# Patient Record
Sex: Male | Born: 1975 | Race: White | Hispanic: No | Marital: Married | Smoking: Never smoker
Health system: Southern US, Community
[De-identification: ages and names within clinical notes are randomized; demographics above are authoritative.]

## PROBLEM LIST (undated history)

## (undated) DIAGNOSIS — W3400XA Accidental discharge from unspecified firearms or gun, initial encounter: Secondary | ICD-10-CM

## (undated) DIAGNOSIS — R0789 Other chest pain: Secondary | ICD-10-CM

## (undated) DIAGNOSIS — R091 Pleurisy: Secondary | ICD-10-CM

## (undated) DIAGNOSIS — E785 Hyperlipidemia, unspecified: Secondary | ICD-10-CM

## (undated) DIAGNOSIS — R58 Hemorrhage, not elsewhere classified: Secondary | ICD-10-CM

## (undated) HISTORY — DX: Pleurisy: R09.1

## (undated) HISTORY — DX: Other chest pain: R07.89

## (undated) HISTORY — DX: Hyperlipidemia, unspecified: E78.5

## (undated) HISTORY — DX: Hemorrhage, not elsewhere classified: R58

## (undated) HISTORY — DX: Accidental discharge from unspecified firearms or gun, initial encounter: W34.00XA

---

## 1994-11-30 DIAGNOSIS — W3400XA Accidental discharge from unspecified firearms or gun, initial encounter: Secondary | ICD-10-CM

## 1994-11-30 DIAGNOSIS — Y249XXA Unspecified firearm discharge, undetermined intent, initial encounter: Secondary | ICD-10-CM

## 1994-11-30 HISTORY — DX: Unspecified firearm discharge, undetermined intent, initial encounter: Y24.9XXA

## 1994-11-30 HISTORY — DX: Accidental discharge from unspecified firearms or gun, initial encounter: W34.00XA

## 1999-11-30 ENCOUNTER — Emergency Department (HOSPITAL_COMMUNITY): Admission: EM | Admit: 1999-11-30 | Discharge: 1999-11-30 | Payer: Self-pay | Admitting: Emergency Medicine

## 1999-12-01 DIAGNOSIS — R58 Hemorrhage, not elsewhere classified: Secondary | ICD-10-CM

## 1999-12-01 HISTORY — PX: HEMORRHOID SURGERY: SHX153

## 1999-12-01 HISTORY — DX: Hemorrhage, not elsewhere classified: R58

## 2005-05-18 ENCOUNTER — Encounter: Admission: RE | Admit: 2005-05-18 | Discharge: 2005-05-18 | Payer: Self-pay | Admitting: Family Medicine

## 2009-03-18 ENCOUNTER — Encounter: Payer: Self-pay | Admitting: Cardiology

## 2009-03-18 HISTORY — PX: US ECHOCARDIOGRAPHY: HXRAD669

## 2009-11-27 ENCOUNTER — Emergency Department (HOSPITAL_COMMUNITY): Admission: EM | Admit: 2009-11-27 | Discharge: 2009-11-27 | Payer: Self-pay | Admitting: Family Medicine

## 2011-03-02 LAB — TSH: TSH: 1.354 u[IU]/mL (ref 0.350–4.500)

## 2012-04-30 ENCOUNTER — Encounter: Payer: Self-pay | Admitting: *Deleted

## 2016-04-30 DIAGNOSIS — Z881 Allergy status to other antibiotic agents status: Secondary | ICD-10-CM | POA: Diagnosis not present

## 2016-04-30 DIAGNOSIS — I10 Essential (primary) hypertension: Secondary | ICD-10-CM | POA: Diagnosis not present

## 2016-04-30 DIAGNOSIS — R05 Cough: Secondary | ICD-10-CM | POA: Diagnosis not present

## 2016-04-30 DIAGNOSIS — R079 Chest pain, unspecified: Secondary | ICD-10-CM | POA: Diagnosis not present

## 2016-04-30 DIAGNOSIS — R110 Nausea: Secondary | ICD-10-CM | POA: Diagnosis not present

## 2016-04-30 DIAGNOSIS — R0989 Other specified symptoms and signs involving the circulatory and respiratory systems: Secondary | ICD-10-CM | POA: Diagnosis not present

## 2016-04-30 DIAGNOSIS — E785 Hyperlipidemia, unspecified: Secondary | ICD-10-CM | POA: Diagnosis not present

## 2016-04-30 DIAGNOSIS — R0602 Shortness of breath: Secondary | ICD-10-CM | POA: Diagnosis not present

## 2016-04-30 DIAGNOSIS — Z880 Allergy status to penicillin: Secondary | ICD-10-CM | POA: Diagnosis not present

## 2017-01-01 DIAGNOSIS — R0683 Snoring: Secondary | ICD-10-CM | POA: Diagnosis not present

## 2017-01-01 DIAGNOSIS — Z1389 Encounter for screening for other disorder: Secondary | ICD-10-CM | POA: Diagnosis not present

## 2017-01-01 DIAGNOSIS — R030 Elevated blood-pressure reading, without diagnosis of hypertension: Secondary | ICD-10-CM | POA: Diagnosis not present

## 2017-01-01 DIAGNOSIS — E784 Other hyperlipidemia: Secondary | ICD-10-CM | POA: Diagnosis not present

## 2017-03-08 DIAGNOSIS — Z125 Encounter for screening for malignant neoplasm of prostate: Secondary | ICD-10-CM | POA: Diagnosis not present

## 2017-03-08 DIAGNOSIS — E784 Other hyperlipidemia: Secondary | ICD-10-CM | POA: Diagnosis not present

## 2017-03-08 DIAGNOSIS — R030 Elevated blood-pressure reading, without diagnosis of hypertension: Secondary | ICD-10-CM | POA: Diagnosis not present

## 2017-10-06 DIAGNOSIS — E7849 Other hyperlipidemia: Secondary | ICD-10-CM | POA: Diagnosis not present

## 2017-10-06 DIAGNOSIS — Z23 Encounter for immunization: Secondary | ICD-10-CM | POA: Diagnosis not present

## 2017-10-06 DIAGNOSIS — J988 Other specified respiratory disorders: Secondary | ICD-10-CM | POA: Diagnosis not present

## 2017-10-06 DIAGNOSIS — I10 Essential (primary) hypertension: Secondary | ICD-10-CM | POA: Diagnosis not present

## 2017-10-06 DIAGNOSIS — Z6833 Body mass index (BMI) 33.0-33.9, adult: Secondary | ICD-10-CM | POA: Diagnosis not present

## 2018-01-21 DIAGNOSIS — Z125 Encounter for screening for malignant neoplasm of prostate: Secondary | ICD-10-CM | POA: Diagnosis not present

## 2018-01-21 DIAGNOSIS — Z0000 Encounter for general adult medical examination without abnormal findings: Secondary | ICD-10-CM | POA: Diagnosis not present

## 2018-01-21 DIAGNOSIS — R82998 Other abnormal findings in urine: Secondary | ICD-10-CM | POA: Diagnosis not present

## 2018-01-21 DIAGNOSIS — I10 Essential (primary) hypertension: Secondary | ICD-10-CM | POA: Diagnosis not present

## 2018-01-25 DIAGNOSIS — Z23 Encounter for immunization: Secondary | ICD-10-CM | POA: Diagnosis not present

## 2018-01-25 DIAGNOSIS — R0683 Snoring: Secondary | ICD-10-CM | POA: Diagnosis not present

## 2018-01-25 DIAGNOSIS — Z0000 Encounter for general adult medical examination without abnormal findings: Secondary | ICD-10-CM | POA: Diagnosis not present

## 2018-01-25 DIAGNOSIS — M722 Plantar fascial fibromatosis: Secondary | ICD-10-CM | POA: Diagnosis not present

## 2018-01-25 DIAGNOSIS — Z1389 Encounter for screening for other disorder: Secondary | ICD-10-CM | POA: Diagnosis not present

## 2018-01-25 DIAGNOSIS — J3089 Other allergic rhinitis: Secondary | ICD-10-CM | POA: Diagnosis not present

## 2018-01-25 DIAGNOSIS — I10 Essential (primary) hypertension: Secondary | ICD-10-CM | POA: Diagnosis not present

## 2018-02-04 DIAGNOSIS — J029 Acute pharyngitis, unspecified: Secondary | ICD-10-CM | POA: Diagnosis not present

## 2018-02-08 ENCOUNTER — Encounter: Payer: Self-pay | Admitting: Neurology

## 2018-02-10 ENCOUNTER — Telehealth: Payer: Self-pay | Admitting: Neurology

## 2018-02-10 ENCOUNTER — Institutional Professional Consult (permissible substitution): Payer: Self-pay | Admitting: Neurology

## 2018-02-10 NOTE — Telephone Encounter (Signed)
Pt was a no show to apt today.  

## 2018-09-14 DIAGNOSIS — D225 Melanocytic nevi of trunk: Secondary | ICD-10-CM | POA: Diagnosis not present

## 2018-09-14 DIAGNOSIS — L918 Other hypertrophic disorders of the skin: Secondary | ICD-10-CM | POA: Diagnosis not present

## 2018-10-28 DIAGNOSIS — J0100 Acute maxillary sinusitis, unspecified: Secondary | ICD-10-CM | POA: Diagnosis not present

## 2019-03-03 DIAGNOSIS — Z125 Encounter for screening for malignant neoplasm of prostate: Secondary | ICD-10-CM | POA: Diagnosis not present

## 2019-03-03 DIAGNOSIS — I10 Essential (primary) hypertension: Secondary | ICD-10-CM | POA: Diagnosis not present

## 2019-03-15 DIAGNOSIS — E785 Hyperlipidemia, unspecified: Secondary | ICD-10-CM | POA: Diagnosis not present

## 2019-03-15 DIAGNOSIS — Z0000 Encounter for general adult medical examination without abnormal findings: Secondary | ICD-10-CM | POA: Diagnosis not present

## 2019-03-15 DIAGNOSIS — Z1331 Encounter for screening for depression: Secondary | ICD-10-CM | POA: Diagnosis not present

## 2019-03-15 DIAGNOSIS — R945 Abnormal results of liver function studies: Secondary | ICD-10-CM | POA: Diagnosis not present

## 2019-03-15 DIAGNOSIS — I10 Essential (primary) hypertension: Secondary | ICD-10-CM | POA: Diagnosis not present

## 2019-03-15 DIAGNOSIS — K219 Gastro-esophageal reflux disease without esophagitis: Secondary | ICD-10-CM | POA: Diagnosis not present

## 2020-03-13 DIAGNOSIS — Z0000 Encounter for general adult medical examination without abnormal findings: Secondary | ICD-10-CM | POA: Diagnosis not present

## 2020-03-13 DIAGNOSIS — Z125 Encounter for screening for malignant neoplasm of prostate: Secondary | ICD-10-CM | POA: Diagnosis not present

## 2020-03-13 DIAGNOSIS — E7849 Other hyperlipidemia: Secondary | ICD-10-CM | POA: Diagnosis not present

## 2020-03-19 DIAGNOSIS — Z0000 Encounter for general adult medical examination without abnormal findings: Secondary | ICD-10-CM | POA: Diagnosis not present

## 2020-03-19 DIAGNOSIS — I10 Essential (primary) hypertension: Secondary | ICD-10-CM | POA: Diagnosis not present

## 2020-03-19 DIAGNOSIS — Z1331 Encounter for screening for depression: Secondary | ICD-10-CM | POA: Diagnosis not present

## 2020-03-19 DIAGNOSIS — R945 Abnormal results of liver function studies: Secondary | ICD-10-CM | POA: Diagnosis not present

## 2020-03-19 DIAGNOSIS — K219 Gastro-esophageal reflux disease without esophagitis: Secondary | ICD-10-CM | POA: Diagnosis not present

## 2020-03-19 DIAGNOSIS — J309 Allergic rhinitis, unspecified: Secondary | ICD-10-CM | POA: Diagnosis not present

## 2020-03-19 DIAGNOSIS — R82998 Other abnormal findings in urine: Secondary | ICD-10-CM | POA: Diagnosis not present

## 2020-08-11 DIAGNOSIS — U071 COVID-19: Secondary | ICD-10-CM | POA: Diagnosis not present

## 2020-08-12 DIAGNOSIS — I10 Essential (primary) hypertension: Secondary | ICD-10-CM | POA: Diagnosis not present

## 2020-08-12 DIAGNOSIS — U071 COVID-19: Secondary | ICD-10-CM | POA: Diagnosis not present

## 2020-08-12 DIAGNOSIS — R05 Cough: Secondary | ICD-10-CM | POA: Diagnosis not present

## 2020-08-12 DIAGNOSIS — R7989 Other specified abnormal findings of blood chemistry: Secondary | ICD-10-CM | POA: Diagnosis not present

## 2020-08-12 DIAGNOSIS — R069 Unspecified abnormalities of breathing: Secondary | ICD-10-CM | POA: Diagnosis not present

## 2020-08-12 DIAGNOSIS — Z880 Allergy status to penicillin: Secondary | ICD-10-CM | POA: Diagnosis not present

## 2020-08-12 DIAGNOSIS — E785 Hyperlipidemia, unspecified: Secondary | ICD-10-CM | POA: Diagnosis not present

## 2020-08-12 DIAGNOSIS — R0602 Shortness of breath: Secondary | ICD-10-CM | POA: Diagnosis not present

## 2020-08-12 DIAGNOSIS — R791 Abnormal coagulation profile: Secondary | ICD-10-CM | POA: Diagnosis not present

## 2020-08-12 DIAGNOSIS — R519 Headache, unspecified: Secondary | ICD-10-CM | POA: Diagnosis not present

## 2020-09-16 DIAGNOSIS — Z23 Encounter for immunization: Secondary | ICD-10-CM | POA: Diagnosis not present

## 2020-09-16 DIAGNOSIS — U071 COVID-19: Secondary | ICD-10-CM | POA: Diagnosis not present

## 2021-05-01 DIAGNOSIS — E785 Hyperlipidemia, unspecified: Secondary | ICD-10-CM | POA: Diagnosis not present

## 2021-05-01 DIAGNOSIS — Z125 Encounter for screening for malignant neoplasm of prostate: Secondary | ICD-10-CM | POA: Diagnosis not present

## 2021-05-05 DIAGNOSIS — Z23 Encounter for immunization: Secondary | ICD-10-CM | POA: Diagnosis not present

## 2021-05-05 DIAGNOSIS — E785 Hyperlipidemia, unspecified: Secondary | ICD-10-CM | POA: Diagnosis not present

## 2021-05-05 DIAGNOSIS — Z1339 Encounter for screening examination for other mental health and behavioral disorders: Secondary | ICD-10-CM | POA: Diagnosis not present

## 2021-05-05 DIAGNOSIS — Z0000 Encounter for general adult medical examination without abnormal findings: Secondary | ICD-10-CM | POA: Diagnosis not present

## 2021-05-05 DIAGNOSIS — Z1331 Encounter for screening for depression: Secondary | ICD-10-CM | POA: Diagnosis not present

## 2021-05-05 DIAGNOSIS — Z1212 Encounter for screening for malignant neoplasm of rectum: Secondary | ICD-10-CM | POA: Diagnosis not present

## 2021-05-05 DIAGNOSIS — R82998 Other abnormal findings in urine: Secondary | ICD-10-CM | POA: Diagnosis not present

## 2021-05-07 ENCOUNTER — Other Ambulatory Visit: Payer: Self-pay | Admitting: Internal Medicine

## 2021-05-07 DIAGNOSIS — E785 Hyperlipidemia, unspecified: Secondary | ICD-10-CM

## 2021-06-09 ENCOUNTER — Other Ambulatory Visit: Payer: Self-pay

## 2021-06-09 ENCOUNTER — Ambulatory Visit
Admission: RE | Admit: 2021-06-09 | Discharge: 2021-06-09 | Disposition: A | Discharge status: Home / Self Care | Payer: No Typology Code available for payment source | Source: Ambulatory Visit | Attending: Internal Medicine | Admitting: Internal Medicine

## 2021-06-09 DIAGNOSIS — E785 Hyperlipidemia, unspecified: Secondary | ICD-10-CM

## 2021-07-22 DIAGNOSIS — M7711 Lateral epicondylitis, right elbow: Secondary | ICD-10-CM | POA: Diagnosis not present

## 2021-11-26 DIAGNOSIS — J3489 Other specified disorders of nose and nasal sinuses: Secondary | ICD-10-CM | POA: Diagnosis not present

## 2021-11-26 DIAGNOSIS — U071 COVID-19: Secondary | ICD-10-CM | POA: Diagnosis not present

## 2022-02-06 DIAGNOSIS — K219 Gastro-esophageal reflux disease without esophagitis: Secondary | ICD-10-CM | POA: Diagnosis not present

## 2022-02-06 DIAGNOSIS — R052 Subacute cough: Secondary | ICD-10-CM | POA: Diagnosis not present

## 2022-05-27 IMAGING — CT CT CARDIAC CORONARY ARTERY CALCIUM SCORE
3 series · 12 of 20 positions shown, 14 images · non-contrast
Comparison: None.

CLINICAL DATA: [age] year-old Caucasian male with
hyperlipidemia

EXAM:
CT CARDIAC CORONARY ARTERY CALCIUM SCORE
TECHNIQUE: Non-contrast imaging through the heart was performed using
prospective ECG gating. Image post processing was performed on an
independent workstation, allowing for quantitative analysis of the
heart and coronary arteries. Note that this exam targets the heart
and the chest was not imaged in its entirety.

[Series 2: calcium scoring 2.00 qr36 bestdiast 70% hrt calciu · axial · 0.42mm/px · z∈[+1657,+1679]mm · 2 of 55 slices shown]
[im 11/55  vessel]
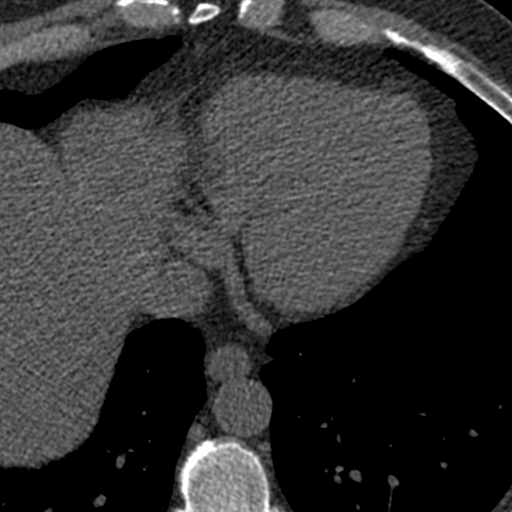
[im 22/55  vessel]
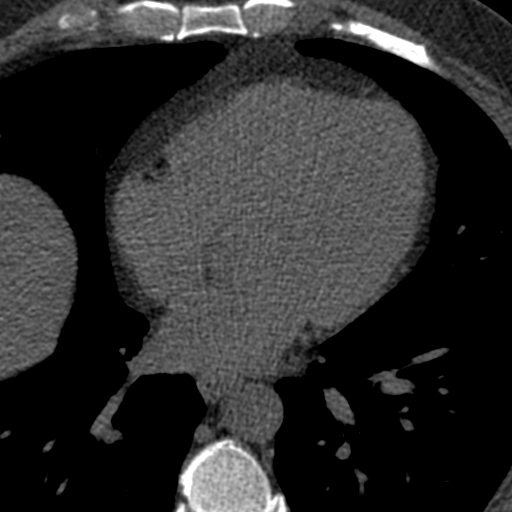

[Series 3: calcium scoring 2.00 br40 bestdiast 70% axial · axial · 0.58mm/px · z∈[+1655,+1727]mm · 5 of 55 slices shown, 7 images]
[im 10/55  vessel]
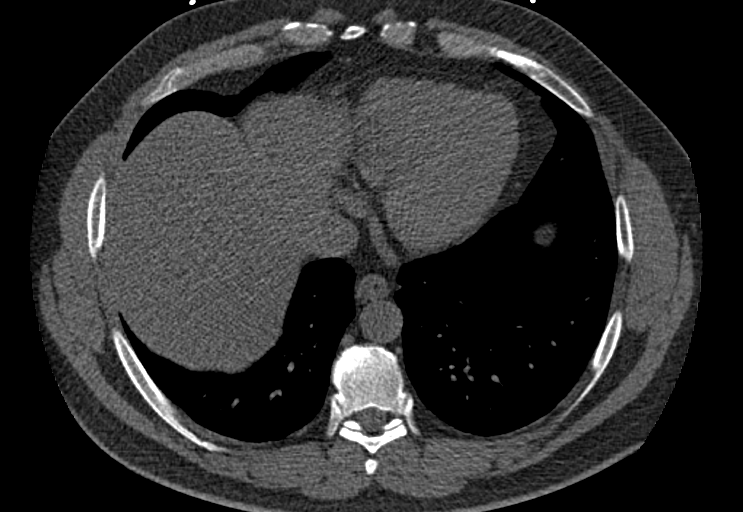
[im 10/55  lung]
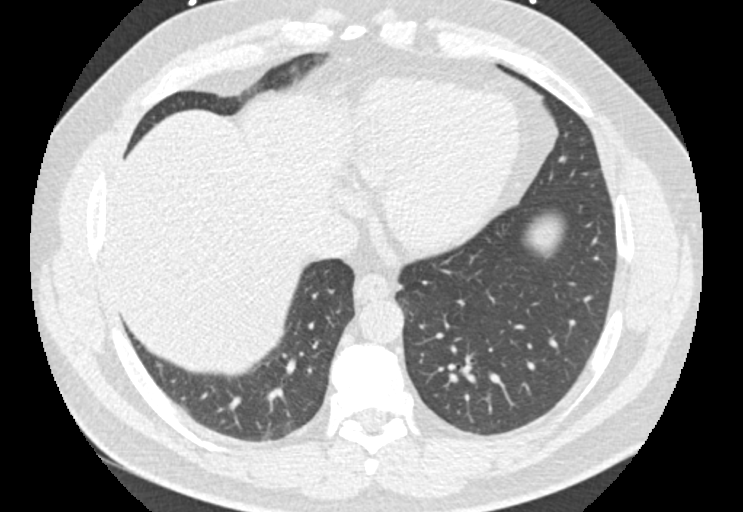
[im 19/55  vessel]
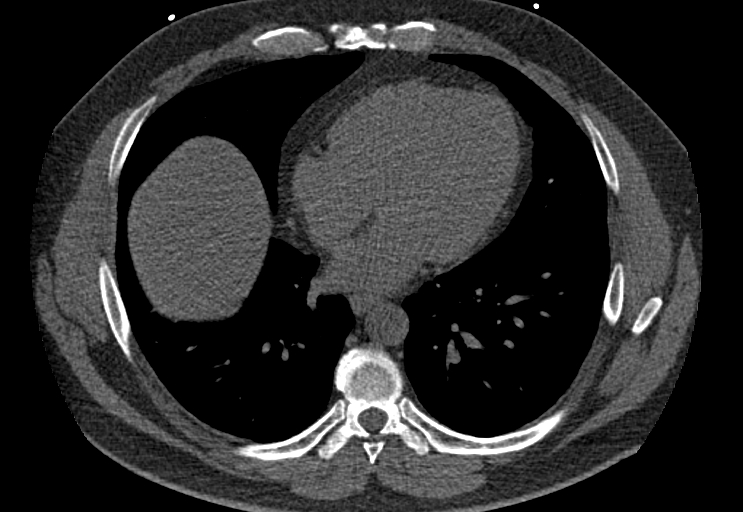
[im 28/55  vessel]
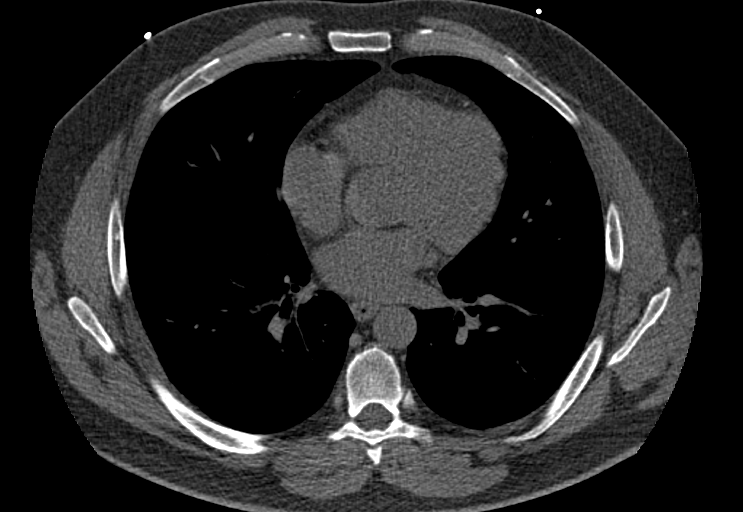
[im 37/55  vessel]
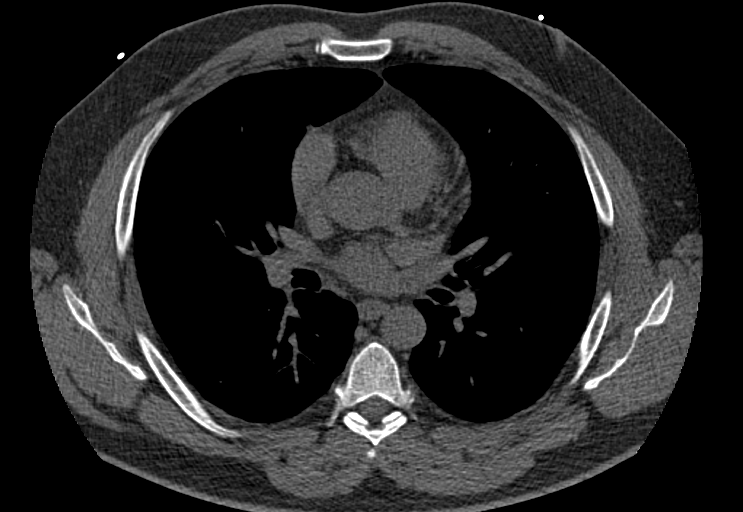
[im 46/55  vessel]
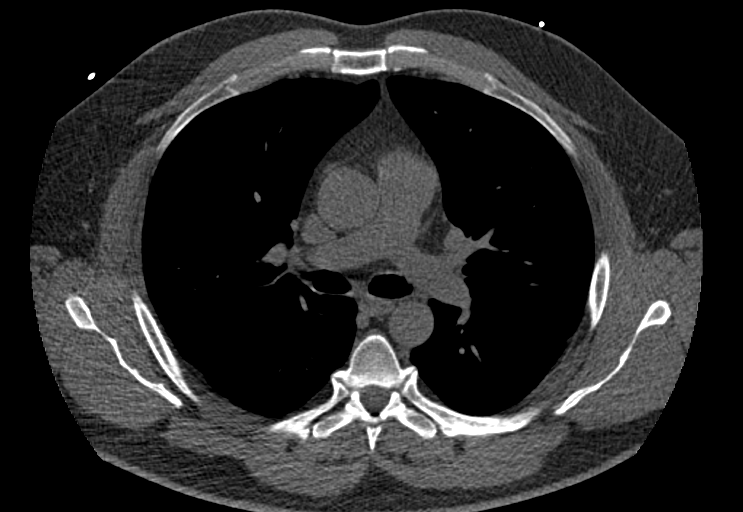
[im 46/55  lung]
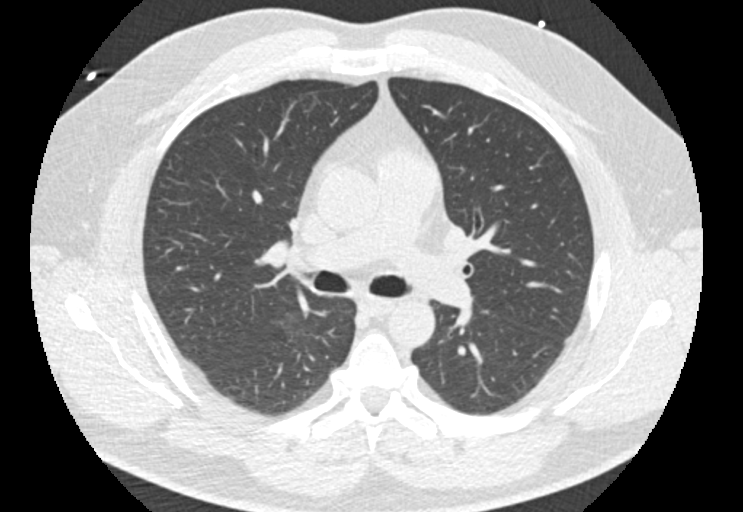

[Series 9: calcium scoring 2.00 br60 bestdiast 70% lungs · axial · 0.58mm/px · z∈[+1655,+1727]mm · 5 of 55 slices shown]
[im 10/55  vessel]
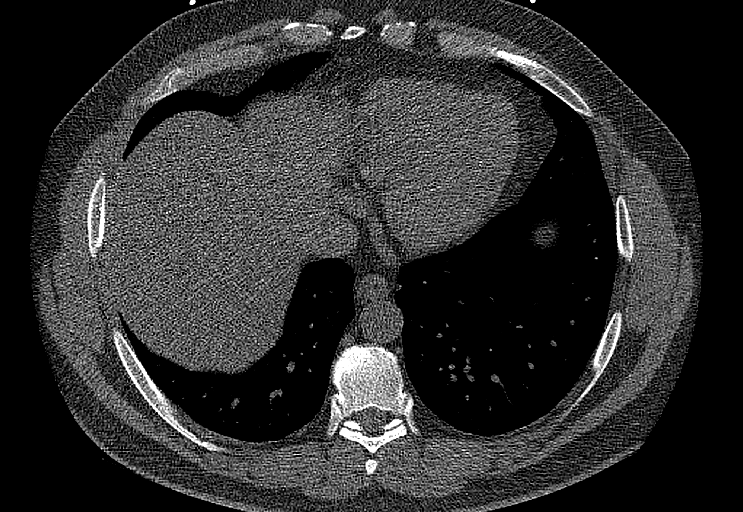
[im 19/55  vessel]
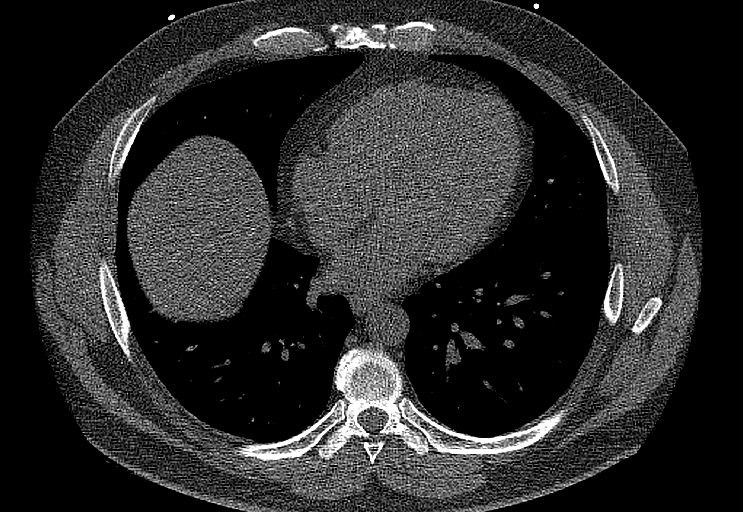
[im 28/55  vessel]
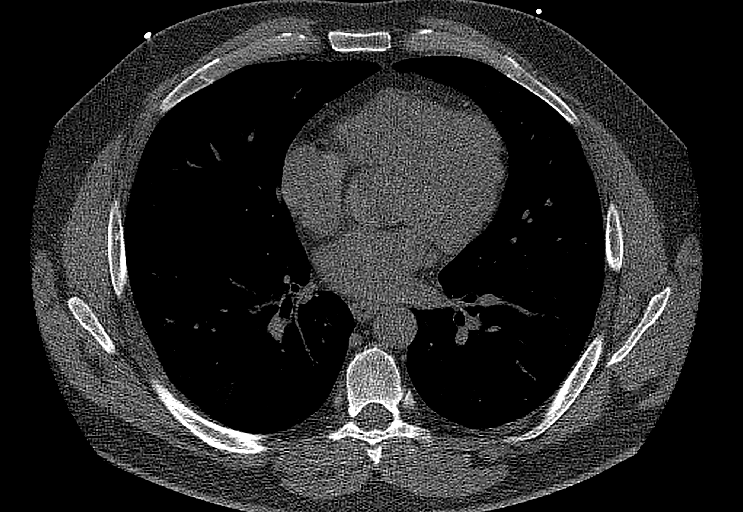
[im 37/55  vessel]
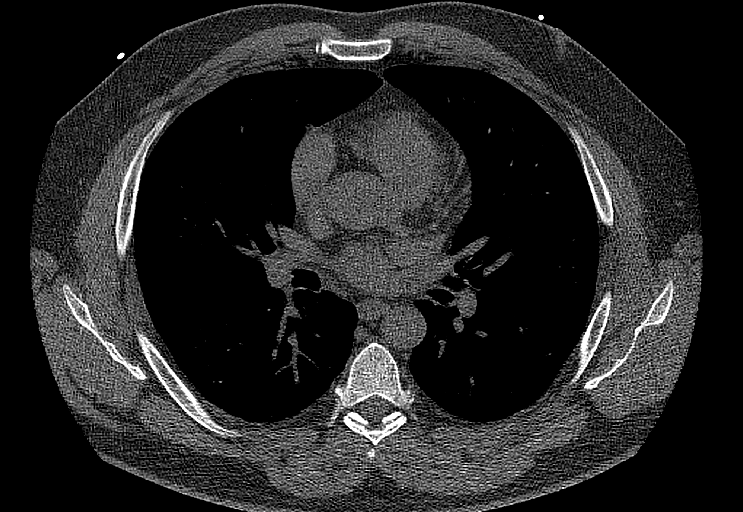
[im 46/55  vessel]
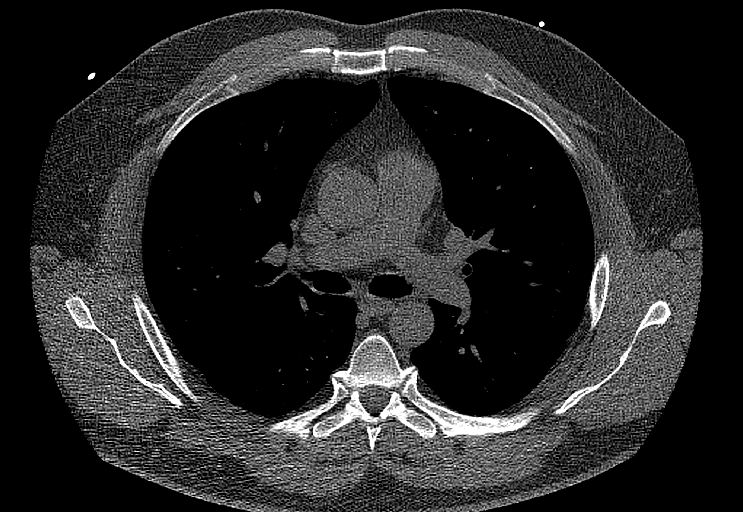

[12 of 20 positions shown; findings below may reference images not displayed]

FINDINGS: Technical quality: Good

CORONARY CALCIUM SCORES:

Left Main: No coronary artery calcification

LAD: 1

LCx: No coronary calcification

RCA: No coronary calcification

CORONARY CALCIUM

Total Agatston Score: 1

[HOSPITAL] percentile: 69

Ascending aorta (normal <  40 mm): 35 mm

EXTRACARDIAC FINDINGS:

Limited view of the lung parenchyma demonstrates no suspicious
nodularity. Airways are normal.

Limited view of the mediastinum demonstrates no adenopathy.
Esophagus normal.

Limited view of the upper abdomen is unremarkable.

Limited view of the skeleton and chest wall is unremarkable.
IMPRESSION: 1. Single LAD coronary artery calcification.

2. Total Agatston Score: 1

3. MESA age and sex matched database percentile: 69

## 2022-06-18 DIAGNOSIS — Z125 Encounter for screening for malignant neoplasm of prostate: Secondary | ICD-10-CM | POA: Diagnosis not present

## 2022-06-18 DIAGNOSIS — I10 Essential (primary) hypertension: Secondary | ICD-10-CM | POA: Diagnosis not present

## 2022-06-24 DIAGNOSIS — Z0000 Encounter for general adult medical examination without abnormal findings: Secondary | ICD-10-CM | POA: Diagnosis not present

## 2022-06-24 DIAGNOSIS — I10 Essential (primary) hypertension: Secondary | ICD-10-CM | POA: Diagnosis not present

## 2022-06-24 DIAGNOSIS — R7301 Impaired fasting glucose: Secondary | ICD-10-CM | POA: Diagnosis not present

## 2022-06-24 DIAGNOSIS — Z1331 Encounter for screening for depression: Secondary | ICD-10-CM | POA: Diagnosis not present

## 2022-11-12 DIAGNOSIS — R0981 Nasal congestion: Secondary | ICD-10-CM | POA: Diagnosis not present

## 2022-11-12 DIAGNOSIS — J0100 Acute maxillary sinusitis, unspecified: Secondary | ICD-10-CM | POA: Diagnosis not present

## 2023-01-15 DIAGNOSIS — R7301 Impaired fasting glucose: Secondary | ICD-10-CM | POA: Diagnosis not present

## 2023-01-15 DIAGNOSIS — I10 Essential (primary) hypertension: Secondary | ICD-10-CM | POA: Diagnosis not present

## 2023-01-15 DIAGNOSIS — I2510 Atherosclerotic heart disease of native coronary artery without angina pectoris: Secondary | ICD-10-CM | POA: Diagnosis not present

## 2023-01-15 DIAGNOSIS — K649 Unspecified hemorrhoids: Secondary | ICD-10-CM | POA: Diagnosis not present

## 2023-01-15 DIAGNOSIS — E785 Hyperlipidemia, unspecified: Secondary | ICD-10-CM | POA: Diagnosis not present

## 2023-08-17 DIAGNOSIS — E785 Hyperlipidemia, unspecified: Secondary | ICD-10-CM | POA: Diagnosis not present

## 2023-08-17 DIAGNOSIS — Z125 Encounter for screening for malignant neoplasm of prostate: Secondary | ICD-10-CM | POA: Diagnosis not present

## 2023-08-19 DIAGNOSIS — R82998 Other abnormal findings in urine: Secondary | ICD-10-CM | POA: Diagnosis not present

## 2023-08-19 DIAGNOSIS — Z1212 Encounter for screening for malignant neoplasm of rectum: Secondary | ICD-10-CM | POA: Diagnosis not present

## 2023-08-19 DIAGNOSIS — R7301 Impaired fasting glucose: Secondary | ICD-10-CM | POA: Diagnosis not present

## 2023-08-19 DIAGNOSIS — Z23 Encounter for immunization: Secondary | ICD-10-CM | POA: Diagnosis not present

## 2023-08-19 DIAGNOSIS — I10 Essential (primary) hypertension: Secondary | ICD-10-CM | POA: Diagnosis not present

## 2023-08-19 DIAGNOSIS — Z0000 Encounter for general adult medical examination without abnormal findings: Secondary | ICD-10-CM | POA: Diagnosis not present

## 2023-09-08 DIAGNOSIS — Z1211 Encounter for screening for malignant neoplasm of colon: Secondary | ICD-10-CM | POA: Diagnosis not present

## 2023-09-08 DIAGNOSIS — E119 Type 2 diabetes mellitus without complications: Secondary | ICD-10-CM | POA: Diagnosis not present

## 2023-10-01 DIAGNOSIS — R748 Abnormal levels of other serum enzymes: Secondary | ICD-10-CM | POA: Diagnosis not present

## 2023-10-01 DIAGNOSIS — K760 Fatty (change of) liver, not elsewhere classified: Secondary | ICD-10-CM | POA: Diagnosis not present

## 2023-10-12 DIAGNOSIS — Z1211 Encounter for screening for malignant neoplasm of colon: Secondary | ICD-10-CM | POA: Diagnosis not present

## 2024-09-11 DIAGNOSIS — R7301 Impaired fasting glucose: Secondary | ICD-10-CM | POA: Diagnosis not present

## 2024-09-11 DIAGNOSIS — Z0189 Encounter for other specified special examinations: Secondary | ICD-10-CM | POA: Diagnosis not present

## 2024-09-11 DIAGNOSIS — Z1212 Encounter for screening for malignant neoplasm of rectum: Secondary | ICD-10-CM | POA: Diagnosis not present

## 2024-09-11 DIAGNOSIS — Z125 Encounter for screening for malignant neoplasm of prostate: Secondary | ICD-10-CM | POA: Diagnosis not present

## 2024-09-11 DIAGNOSIS — Z1389 Encounter for screening for other disorder: Secondary | ICD-10-CM | POA: Diagnosis not present

## 2024-09-11 DIAGNOSIS — E785 Hyperlipidemia, unspecified: Secondary | ICD-10-CM | POA: Diagnosis not present

## 2024-09-18 DIAGNOSIS — Z0000 Encounter for general adult medical examination without abnormal findings: Secondary | ICD-10-CM | POA: Diagnosis not present

## 2024-09-18 DIAGNOSIS — Z23 Encounter for immunization: Secondary | ICD-10-CM | POA: Diagnosis not present

## 2024-09-18 DIAGNOSIS — I10 Essential (primary) hypertension: Secondary | ICD-10-CM | POA: Diagnosis not present

## 2024-09-18 DIAGNOSIS — Z1331 Encounter for screening for depression: Secondary | ICD-10-CM | POA: Diagnosis not present

## 2024-10-21 DIAGNOSIS — M542 Cervicalgia: Secondary | ICD-10-CM | POA: Diagnosis not present

## 2024-10-21 DIAGNOSIS — Z041 Encounter for examination and observation following transport accident: Secondary | ICD-10-CM | POA: Diagnosis not present

## 2024-10-21 DIAGNOSIS — R0789 Other chest pain: Secondary | ICD-10-CM | POA: Diagnosis not present

## 2024-10-21 DIAGNOSIS — Y92410 Unspecified street and highway as the place of occurrence of the external cause: Secondary | ICD-10-CM | POA: Diagnosis not present

## 2024-10-21 DIAGNOSIS — R1030 Lower abdominal pain, unspecified: Secondary | ICD-10-CM | POA: Diagnosis not present

## 2024-10-22 DIAGNOSIS — R079 Chest pain, unspecified: Secondary | ICD-10-CM | POA: Diagnosis not present
